# Patient Record
Sex: Male | Born: 1995 | Race: White | Hispanic: No | Marital: Single | Smoking: Former smoker
Health system: Southern US, Community
[De-identification: ages and names within clinical notes are randomized; demographics above are authoritative.]

## PROBLEM LIST (undated history)

## (undated) DIAGNOSIS — R350 Frequency of micturition: Secondary | ICD-10-CM

## (undated) DIAGNOSIS — F419 Anxiety disorder, unspecified: Secondary | ICD-10-CM

## (undated) DIAGNOSIS — K219 Gastro-esophageal reflux disease without esophagitis: Secondary | ICD-10-CM

## (undated) HISTORY — DX: Frequency of micturition: R35.0

## (undated) HISTORY — PX: OTHER SURGICAL HISTORY: SHX169

## (undated) HISTORY — DX: Anxiety disorder, unspecified: F41.9

---
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
## 2016-10-26 ENCOUNTER — Emergency Department (HOSPITAL_COMMUNITY)
Admission: EM | Admit: 2016-10-26 | Discharge: 2016-10-27 | Disposition: A | Discharge status: Home / Self Care | Payer: BLUE CROSS/BLUE SHIELD | Attending: Emergency Medicine | Admitting: Emergency Medicine

## 2016-10-26 DIAGNOSIS — R109 Unspecified abdominal pain: Secondary | ICD-10-CM

## 2016-10-26 DIAGNOSIS — Z79899 Other long term (current) drug therapy: Secondary | ICD-10-CM | POA: Diagnosis not present

## 2016-10-26 DIAGNOSIS — R1033 Periumbilical pain: Secondary | ICD-10-CM | POA: Insufficient documentation

## 2016-10-26 LAB — COMPREHENSIVE METABOLIC PANEL
ALT: 63 U/L (ref 17–63)
AST: 40 U/L (ref 15–41)
Albumin: 4.7 g/dL (ref 3.5–5.0)
Alkaline Phosphatase: 82 U/L (ref 38–126)
Anion gap: 11 (ref 5–15)
BUN: 15 mg/dL (ref 6–20)
CHLORIDE: 102 mmol/L (ref 101–111)
CO2: 23 mmol/L (ref 22–32)
CREATININE: 1.06 mg/dL (ref 0.61–1.24)
Calcium: 9.6 mg/dL (ref 8.9–10.3)
GFR calc Af Amer: >60 mL/min (ref >60)
GFR calc non Af Amer: >60 mL/min (ref >60)
Glucose, Bld: 89 mg/dL (ref 65–99)
Potassium: 4.7 mmol/L (ref 3.5–5.1)
Sodium: 136 mmol/L (ref 135–145)
Total Bilirubin: 0.6 mg/dL (ref 0.3–1.2)
Total Protein: 7.6 g/dL (ref 6.5–8.1)

## 2016-10-26 LAB — CBC
HCT: 45.9 % (ref 39.0–52.0)
Hemoglobin: 15.7 g/dL (ref 13.0–17.0)
MCH: 29.7 pg (ref 26.0–34.0)
MCHC: 34.2 g/dL (ref 30.0–36.0)
MCV: 86.8 fL (ref 78.0–100.0)
PLATELETS: 191 10*3/uL (ref 150–400)
RBC: 5.29 MIL/uL (ref 4.22–5.81)
RDW: 13.1 % (ref 11.5–15.5)
WBC: 6.4 10*3/uL (ref 4.0–10.5)

## 2016-10-26 LAB — URINALYSIS, ROUTINE W REFLEX MICROSCOPIC
Bilirubin Urine: NEGATIVE
GLUCOSE, UA: NEGATIVE mg/dL
Hgb urine dipstick: NEGATIVE
Ketones, ur: NEGATIVE mg/dL
Leukocytes, UA: NEGATIVE
Nitrite: NEGATIVE
PROTEIN: NEGATIVE mg/dL
Specific Gravity, Urine: 1.01 (ref 1.005–1.030)
pH: 5 (ref 5.0–8.0)

## 2016-10-26 LAB — LIPASE, BLOOD: LIPASE: 21 U/L (ref 11–51)

## 2016-10-26 MED ORDER — IOPAMIDOL (ISOVUE-300) INJECTION 61%
INTRAVENOUS | Status: AC
  Administered 2016-10-27: 100 mL
  Filled 2016-10-26: qty 100

## 2016-10-26 MED ORDER — FENTANYL CITRATE (PF) 100 MCG/2ML IJ SOLN
50.0000 ug | Freq: Once | INTRAMUSCULAR | Status: AC
  Administered 2016-10-27: 50 ug via INTRAVENOUS
  Filled 2016-10-26: qty 2

## 2016-10-26 NOTE — ED Triage Notes (Signed)
Pt reports abdominal pain around his umbilicus that started a week ago. Denies n/v but had diarrhea on Wednesday but none since.
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
## 2016-10-27 ENCOUNTER — Emergency Department (HOSPITAL_COMMUNITY): Payer: BLUE CROSS/BLUE SHIELD

## 2016-10-27 ENCOUNTER — Encounter (HOSPITAL_COMMUNITY): Payer: Self-pay

## 2016-10-27 MED ORDER — ONDANSETRON 4 MG PO TBDP: 4.0000 mg | ORAL_TABLET | Freq: Three times a day (TID) | ORAL | 0 refills | Status: DC | PRN

## 2016-10-27 NOTE — Discharge Instructions (Signed)
Your CT is negative for appendicitis. Keep yourself hydrated. Followup with your doctor. Return to the ED if you develop new or worsening symptoms.

## 2016-10-29 ENCOUNTER — Encounter (HOSPITAL_COMMUNITY): Payer: Self-pay | Admitting: Vascular Surgery

## 2016-10-29 ENCOUNTER — Emergency Department (HOSPITAL_COMMUNITY)
Admission: EM | Admit: 2016-10-29 | Discharge: 2016-10-29 | Disposition: A | Discharge status: Home / Self Care | Payer: BLUE CROSS/BLUE SHIELD | Attending: Emergency Medicine | Admitting: Emergency Medicine

## 2016-10-29 DIAGNOSIS — R109 Unspecified abdominal pain: Secondary | ICD-10-CM | POA: Diagnosis not present

## 2016-10-29 DIAGNOSIS — Z5321 Procedure and treatment not carried out due to patient leaving prior to being seen by health care provider: Secondary | ICD-10-CM | POA: Insufficient documentation

## 2016-10-29 LAB — URINALYSIS, ROUTINE W REFLEX MICROSCOPIC
Bilirubin Urine: NEGATIVE
GLUCOSE, UA: NEGATIVE mg/dL
HGB URINE DIPSTICK: NEGATIVE
Ketones, ur: NEGATIVE mg/dL
LEUKOCYTES UA: NEGATIVE
Nitrite: NEGATIVE
PH: 5 (ref 5.0–8.0)
Protein, ur: NEGATIVE mg/dL
Specific Gravity, Urine: 1.017 (ref 1.005–1.030)

## 2016-10-29 LAB — CBC
HEMATOCRIT: 46.8 % (ref 39.0–52.0)
Hemoglobin: 16.1 g/dL (ref 13.0–17.0)
MCH: 29.9 pg (ref 26.0–34.0)
MCHC: 34.4 g/dL (ref 30.0–36.0)
MCV: 86.8 fL (ref 78.0–100.0)
Platelets: 187 10*3/uL (ref 150–400)
RBC: 5.39 MIL/uL (ref 4.22–5.81)
RDW: 13 % (ref 11.5–15.5)
WBC: 7.4 10*3/uL (ref 4.0–10.5)

## 2016-10-29 LAB — COMPREHENSIVE METABOLIC PANEL
ALT: 49 U/L (ref 17–63)
ANION GAP: 8 (ref 5–15)
AST: 28 U/L (ref 15–41)
Albumin: 4.6 g/dL (ref 3.5–5.0)
Alkaline Phosphatase: 85 U/L (ref 38–126)
BUN: 13 mg/dL (ref 6–20)
CHLORIDE: 106 mmol/L (ref 101–111)
CO2: 25 mmol/L (ref 22–32)
Calcium: 10 mg/dL (ref 8.9–10.3)
Creatinine, Ser: 1.25 mg/dL — ABNORMAL HIGH (ref 0.61–1.24)
GFR calc Af Amer: >60 mL/min (ref >60)
GFR calc non Af Amer: >60 mL/min (ref >60)
Glucose, Bld: 107 mg/dL — ABNORMAL HIGH (ref 65–99)
Potassium: 4.9 mmol/L (ref 3.5–5.1)
SODIUM: 139 mmol/L (ref 135–145)
Total Bilirubin: 0.7 mg/dL (ref 0.3–1.2)
Total Protein: 7.3 g/dL (ref 6.5–8.1)

## 2016-10-29 LAB — LIPASE, BLOOD: LIPASE: 21 U/L (ref 11–51)

## 2016-10-29 NOTE — ED Triage Notes (Signed)
Pt reports to the ED for eval of abd pain around his umbilicus x over a week. Pt was seen on 5/4 for this pain and had a CT scan which did not show anything and he was d/c. He states that his pain never went away but today his pain became much worse. He called to schedule with his PCP but could not be seen until next week and he states he cannot wait. Pt clutching. Pt denies any aggravating or relieving factors. Pt has been taking bentyl without relief. Denies any N/V/D.

## 2016-10-29 NOTE — ED Notes (Signed)
Pt came to front desk and asked about the wait time. This RN informed pt of ER process. Pt left post triage after being encouraged to stay by this RN

## 2016-10-30 ENCOUNTER — Encounter: Payer: Self-pay | Admitting: Nurse Practitioner

## 2016-11-08 ENCOUNTER — Ambulatory Visit: Payer: BLUE CROSS/BLUE SHIELD | Admitting: Nurse Practitioner

## 2016-11-14 ENCOUNTER — Ambulatory Visit: Payer: BLUE CROSS/BLUE SHIELD | Admitting: Nurse Practitioner

## 2018-02-12 IMAGING — CT CT ABD-PELV W/ CM
2 of 4 series · 17 of 46 positions shown, 19 images · IV contrast (Omni 300)
Comparison: 10/22/2012

CLINICAL DATA: Periumbilical pain

EXAM:
CT ABDOMEN AND PELVIS WITH CONTRAST
TECHNIQUE: Multidetector CT imaging of the abdomen and pelvis was performed
using the standard protocol following bolus administration of
intravenous contrast.
CONTRAST:  100mL DP4JDA-677 IOPAMIDOL (DP4JDA-677) INJECTION 61%

[Series 3: a/p w/ 5mm · axial · 0.94mm/px · z∈[-351,+94]mm · 14 of 99 slices shown, 16 images]
[im 5/99  soft-tissue]
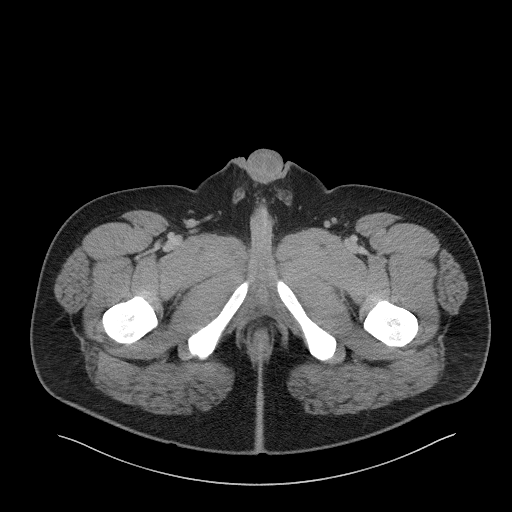
[im 5/99  bone]
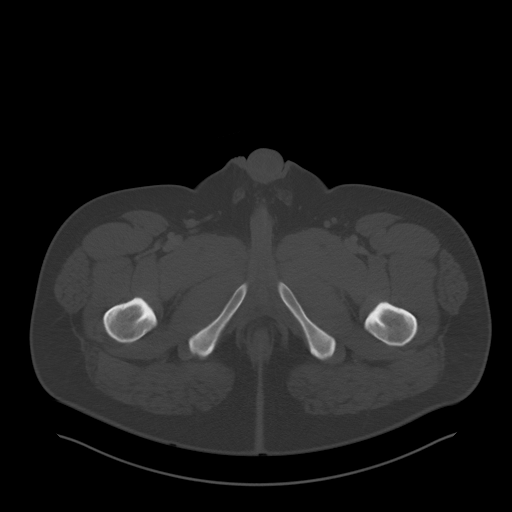
[im 13/99  soft-tissue]
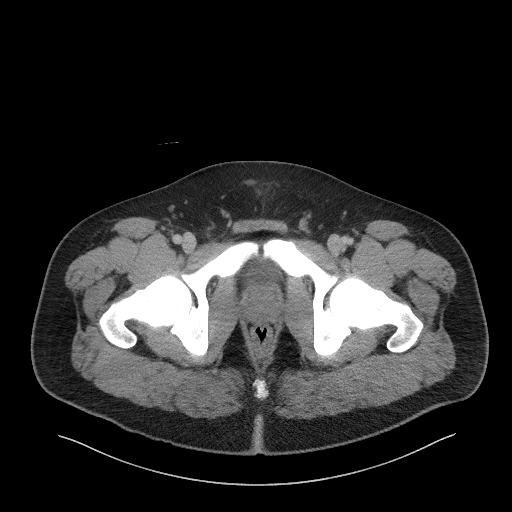
[im 18/99  soft-tissue]
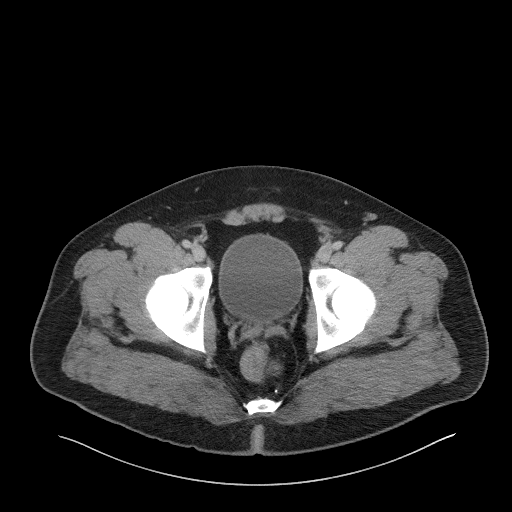
[im 26/99  soft-tissue]
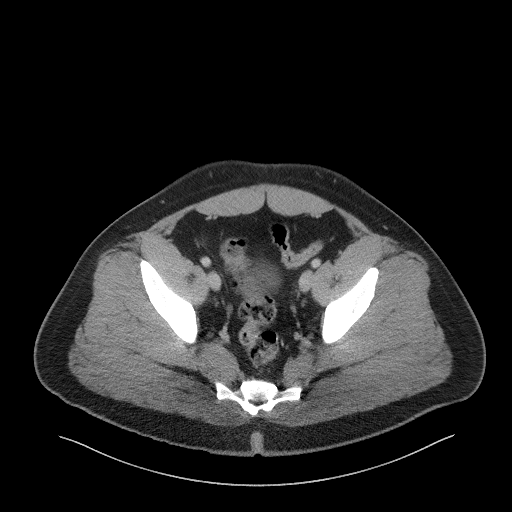
[im 35/99  soft-tissue]
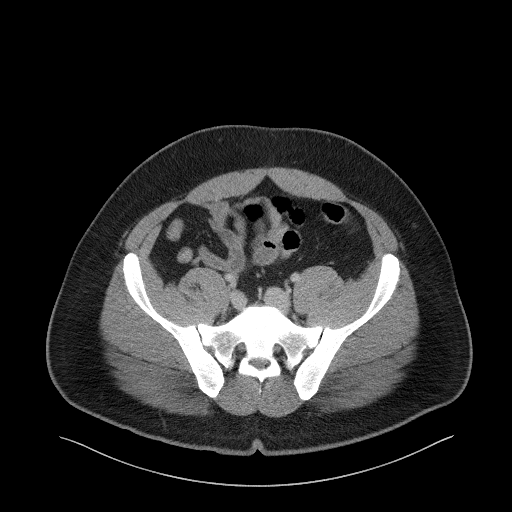
[im 39/99  soft-tissue]
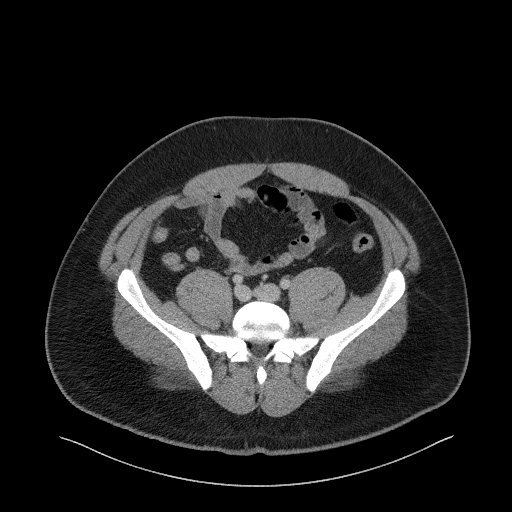
[im 47/99  soft-tissue]
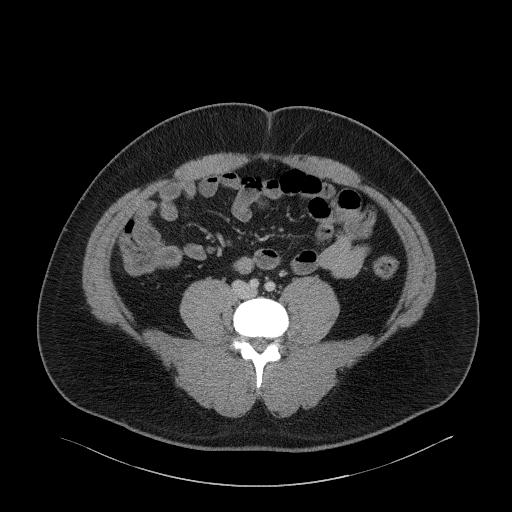
[im 52/99  soft-tissue]
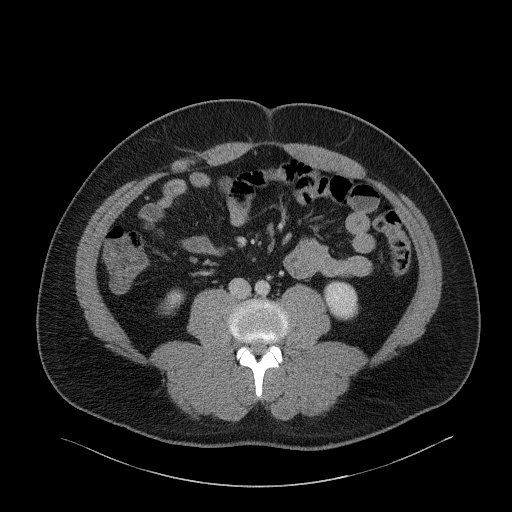
[im 60/99  soft-tissue]
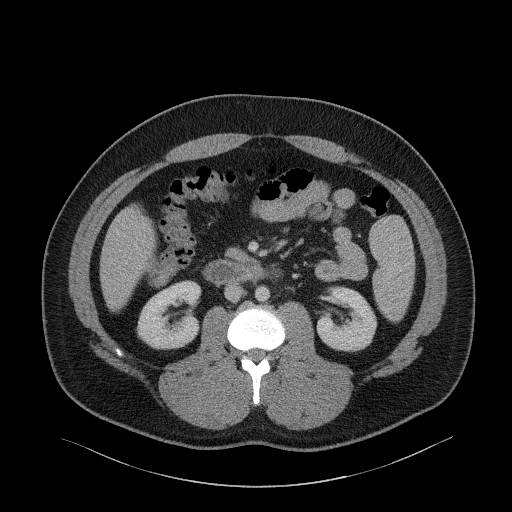
[im 60/99  bone]
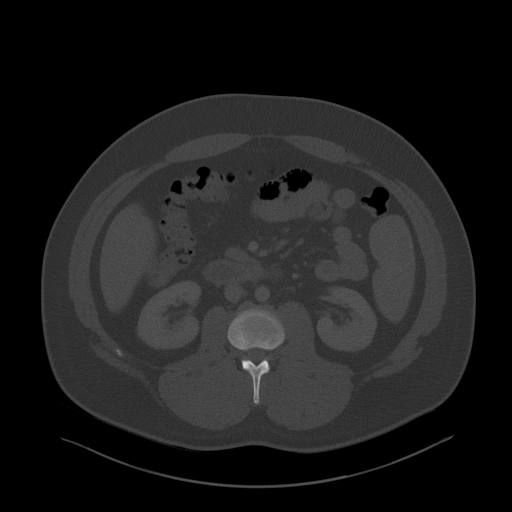
[im 64/99  soft-tissue]
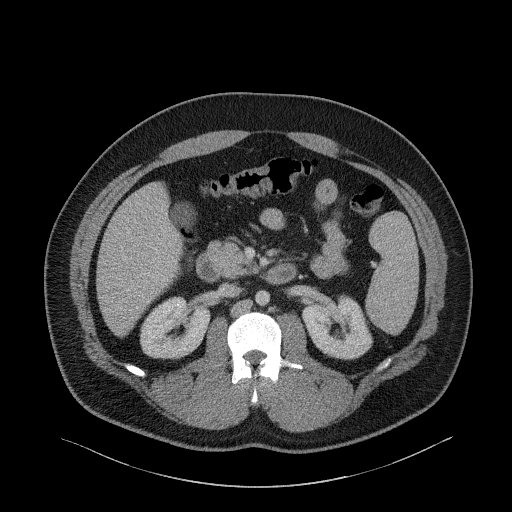
[im 73/99  soft-tissue]
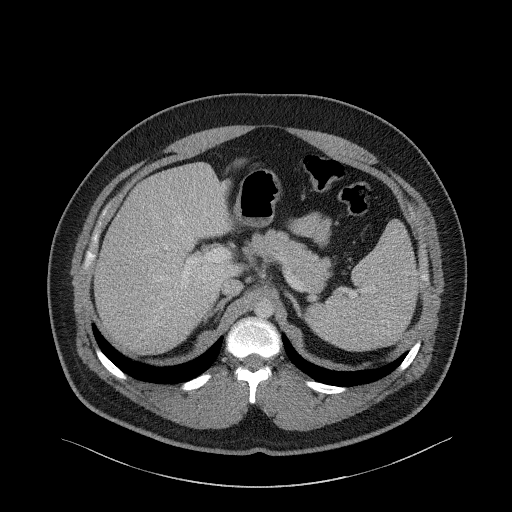
[im 81/99  soft-tissue]
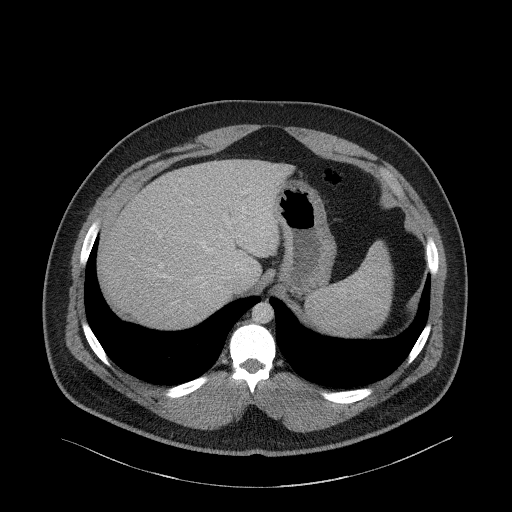
[im 86/99  soft-tissue]
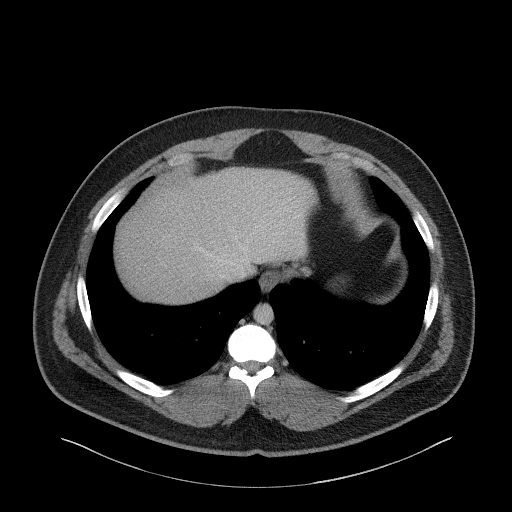
[im 94/99  soft-tissue]
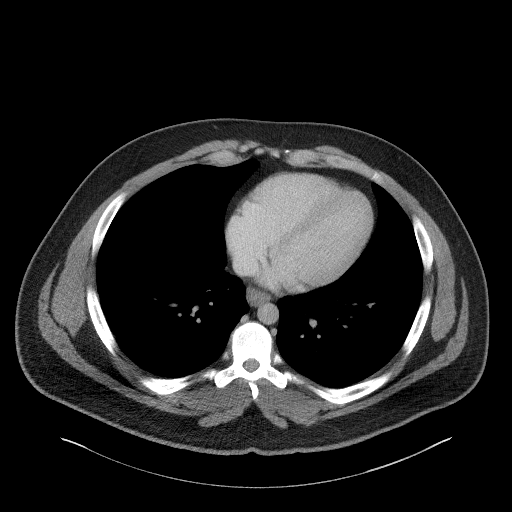

[Series 6: a/p w/ cor · coronal · 0.96mm/px · 3 of 160 slices shown]
[im 54/160  soft-tissue]
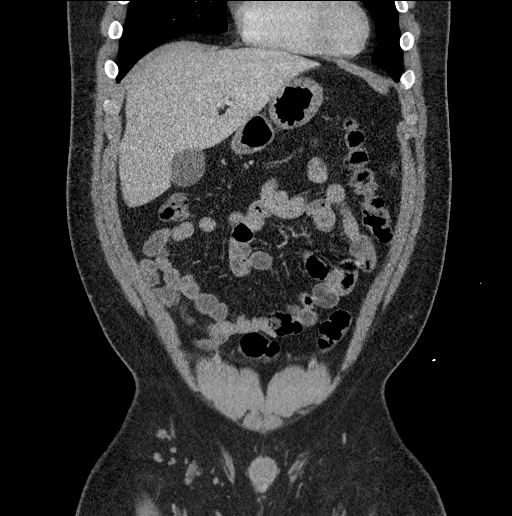
[im 71/160  soft-tissue]
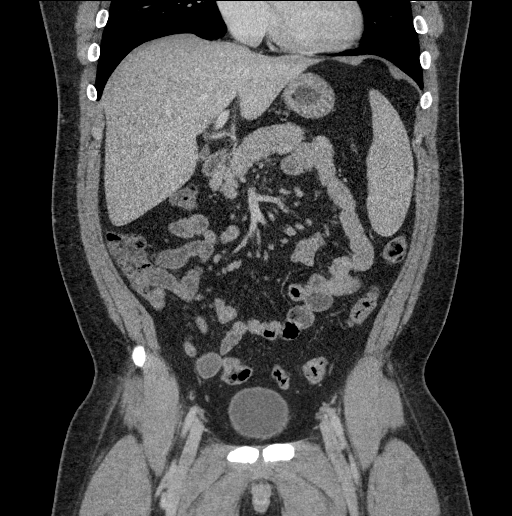
[im 89/160  soft-tissue]
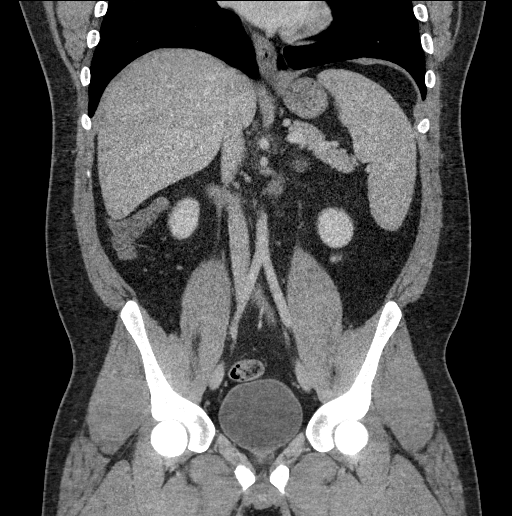

[17 of 46 positions shown; findings below may reference images not displayed]

FINDINGS: Lower chest: Lung bases demonstrate no acute consolidation or
effusion. The heart is nonenlarged.

Hepatobiliary: No focal liver abnormality is seen. No gallstones,
gallbladder wall thickening, or biliary dilatation.

Pancreas: Unremarkable. No pancreatic ductal dilatation or
surrounding inflammatory changes.

Spleen: Enlarged at 15.6 cm

Adrenals/Urinary Tract: Adrenal glands are within normal limits.
Punctate nonobstructing stone in the right kidney. Normal bladder

Stomach/Bowel: Stomach is within normal limits. Appendix appears
normal. No evidence of bowel wall thickening, distention, or
inflammatory changes.

Vascular/Lymphatic: No significant vascular findings are present. No
enlarged abdominal or pelvic lymph nodes.

Reproductive: Prostate is unremarkable.

Other: No abdominal wall hernia or abnormality. No abdominopelvic
ascites.

Musculoskeletal: No acute or significant osseous findings.
IMPRESSION: 1. No CT evidence for acute intra-abdominal or pelvic pathology.
Negative for appendicitis
2. Enlarged spleen.

## 2022-05-29 ENCOUNTER — Ambulatory Visit
Admission: EM | Admit: 2022-05-29 | Discharge: 2022-05-29 | Disposition: A | Discharge status: Home / Self Care | Payer: BC Managed Care – PPO | Attending: Family Medicine | Admitting: Family Medicine

## 2022-05-29 DIAGNOSIS — R6889 Other general symptoms and signs: Secondary | ICD-10-CM

## 2022-05-29 LAB — POC SARS CORONAVIRUS 2 AG -  ED: SARS Coronavirus 2 Ag: NEGATIVE

## 2022-05-29 LAB — POCT INFLUENZA A/B
Influenza A, POC: NEGATIVE
Influenza B, POC: NEGATIVE
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
## 2022-05-29 NOTE — ED Triage Notes (Signed)
Pt states that he has some chills, body aches, cough, sore throat, chest congestion, and headache. X1 day   Pt is vaccinated for covid.

## 2022-05-29 NOTE — Discharge Instructions (Signed)
Home Rest Push fluids

## 2022-09-04 ENCOUNTER — Encounter: Payer: Self-pay | Admitting: Family Medicine

## 2022-09-04 ENCOUNTER — Ambulatory Visit: Payer: BC Managed Care – PPO | Admitting: Family Medicine

## 2022-09-04 VITALS — BP 138/75 | HR 64 | Ht 71.0 in | Wt 246.1 lb

## 2022-09-04 DIAGNOSIS — F411 Generalized anxiety disorder: Secondary | ICD-10-CM | POA: Diagnosis not present

## 2022-09-04 MED ORDER — ESCITALOPRAM OXALATE 10 MG PO TABS: 10.0000 mg | ORAL_TABLET | Freq: Every day | ORAL | 3 refills | Status: DC

## 2022-09-04 NOTE — Assessment & Plan Note (Addendum)
-   start lexapro 10mg . Discussed side effects - follow up in 4 weeks for efficacy  - GAD7: 9

## 2022-09-04 NOTE — Progress Notes (Signed)
     Established patient visit   Patient: [name]   DOB: [birth year]   27 y.o. Male  MRN: [id number] Visit Date: 09/04/2022  Today's healthcare provider: Owens Loffler, DO   Chief Complaint  Patient presents with   New Patient (Initial Visit)    SUBJECTIVE    Chief Complaint  Patient presents with   New Patient (Initial Visit)   HPI  Pt presents to establish care.   Anxiety  Pt presents for concerns of anxiety. He has a hx of anxiety. He is currently a caretaker and started a new job. Denies homicidal or suicidal ideation. He is open to medication.   Review of Systems  Constitutional:  Negative for activity change, fatigue and fever.  Respiratory:  Negative for cough and shortness of breath.   Cardiovascular:  Negative for chest pain.  Gastrointestinal:  Negative for abdominal pain.  Genitourinary:  Negative for difficulty urinating.  Psychiatric/Behavioral:  Negative for sleep disturbance and suicidal ideas.        Current Meds  Medication Sig   escitalopram (LEXAPRO) 10 MG tablet Take 1 tablet (10 mg total) by mouth daily.    OBJECTIVE    BP 138/75 (BP Location: Left Arm, Patient Position: Sitting, Cuff Size: Large)   Pulse 64   Ht 5\' 11"  (1.803 m)   Wt 246 lb 1.9 oz (111.6 kg)   SpO2 100%   BMI 34.33 kg/m   Physical Exam Vitals and nursing note reviewed.  Constitutional:      General: He is not in acute distress.    Appearance: Normal appearance.  HENT:     Head: Normocephalic and atraumatic.     Right Ear: External ear normal.     Left Ear: External ear normal.     Nose: Nose normal.  Eyes:     Conjunctiva/sclera: Conjunctivae normal.  Cardiovascular:     Rate and Rhythm: Normal rate and regular rhythm.  Pulmonary:     Effort: Pulmonary effort is normal.     Breath sounds: Normal breath sounds.  Neurological:     General: No focal deficit present.     Mental Status: He is alert and oriented to person, place, and time.  Psychiatric:         Mood and Affect: Mood normal.        Behavior: Behavior normal.        Thought Content: Thought content normal.        Judgment: Judgment normal.        ASSESSMENT & PLAN    Problem List Items Addressed This Visit       Other   Generalized anxiety disorder - Primary    - start lexapro 10mg . Discussed side effects - follow up in 4 weeks for efficacy  - GAD7: 9      Relevant Medications   escitalopram (LEXAPRO) 10 MG tablet    Return in about 4 weeks (around 10/02/2022).      Meds ordered this encounter  Medications   escitalopram (LEXAPRO) 10 MG tablet    Sig: Take 1 tablet (10 mg total) by mouth daily.    Dispense:  30 tablet    Refill:  3    No orders of the defined types were placed in this encounter.    Owens Loffler, DO  [facility] [phone] (phone) [phone] (fax)  [place]

## 2022-10-01 NOTE — Progress Notes (Unsigned)
     Established patient visit   Patient: [name]   DOB: 03-06-1996   26 y.o. Male  MRN: [id number] Visit Date: 10/02/2022  Today's healthcare provider: Charlton Amor, DO   No chief complaint on file.   SUBJECTIVE   No chief complaint on file.  HPI  Pt presents for follow up on GAD. He is currently on lexapro 10mg  daily.  Review of Systems     No outpatient medications have been marked as taking for the 10/02/22 encounter (Appointment) with Charlton Amor, DO.    OBJECTIVE    There were no vitals taken for this visit.  Physical Exam   {Show previous labs (optional):23736}    ASSESSMENT & PLAN    Problem List Items Addressed This Visit   None   No follow-ups on file.      No orders of the defined types were placed in this encounter.   No orders of the defined types were placed in this encounter.    Charlton Amor, DO  [facility] at [facility] [phone] (phone) [phone] (fax)  [facility]

## 2022-10-02 ENCOUNTER — Ambulatory Visit: Payer: BC Managed Care – PPO | Admitting: Family Medicine

## 2022-10-02 ENCOUNTER — Encounter: Payer: Self-pay | Admitting: Family Medicine

## 2022-10-02 VITALS — BP 134/68 | HR 58 | Temp 98.8°F | Ht 71.0 in | Wt 245.0 lb

## 2022-10-02 DIAGNOSIS — F411 Generalized anxiety disorder: Secondary | ICD-10-CM | POA: Diagnosis not present

## 2022-10-02 MED ORDER — ESCITALOPRAM OXALATE 10 MG PO TABS: 10.0000 mg | ORAL_TABLET | Freq: Every day | ORAL | 0 refills | Status: DC

## 2022-10-02 NOTE — Assessment & Plan Note (Signed)
-   continue lexapro 10mg . We did discuss ability to increase to 20mg  and pt is hesitant at this time. He may try to increase to 15mg  to which I said is ok.

## 2022-10-31 ENCOUNTER — Encounter: Payer: BC Managed Care – PPO | Admitting: Family Medicine

## 2022-11-21 ENCOUNTER — Encounter: Payer: BC Managed Care – PPO | Admitting: Family Medicine

## 2022-12-28 ENCOUNTER — Other Ambulatory Visit: Payer: Self-pay | Admitting: Family Medicine

## 2023-04-24 ENCOUNTER — Ambulatory Visit: Payer: BC Managed Care – PPO

## 2023-04-24 ENCOUNTER — Encounter: Payer: Self-pay | Admitting: Family Medicine

## 2023-04-24 ENCOUNTER — Ambulatory Visit: Payer: BC Managed Care – PPO | Admitting: Family Medicine

## 2023-04-24 VITALS — BP 126/66 | HR 56 | Ht 71.0 in | Wt 258.8 lb

## 2023-04-24 DIAGNOSIS — M79672 Pain in left foot: Secondary | ICD-10-CM

## 2023-04-24 MED ORDER — MELOXICAM 15 MG PO TABS: 15.0000 mg | ORAL_TABLET | Freq: Every day | ORAL | 0 refills | Status: DC

## 2023-04-24 NOTE — Assessment & Plan Note (Addendum)
Recommend RICE therapy in addition to meloxicam, due to chronicity and trauma will go ahead and get xrays. Notes pain in the 5th metatarsal area and cuboid region. If xrays are negative we can consider PT for 4-6 weeks prior to further mri imaging
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
## 2023-04-30 ENCOUNTER — Encounter: Payer: BC Managed Care – PPO | Admitting: Family Medicine

## 2023-05-14 ENCOUNTER — Other Ambulatory Visit: Payer: Self-pay | Admitting: Family Medicine

## 2023-05-14 DIAGNOSIS — M79672 Pain in left foot: Secondary | ICD-10-CM

## 2023-07-01 ENCOUNTER — Encounter (INDEPENDENT_AMBULATORY_CARE_PROVIDER_SITE_OTHER): Payer: Self-pay | Admitting: Family Medicine

## 2023-07-01 DIAGNOSIS — Z13228 Encounter for screening for other metabolic disorders: Secondary | ICD-10-CM

## 2023-07-02 NOTE — Telephone Encounter (Signed)
 Please see the MyChart message reply(ies) for my assessment and plan.    This patient gave consent for this Medical Advice Message and is aware that it may result in a bill to [employer], as well as the possibility of receiving a bill for a co-payment or deductible. They are an established patient, but are not seeking medical advice exclusively about a problem treated during an in person or video visit in the last seven days. I did not recommend an in person or video visit within seven days of my reply.    I spent a total of 12 minutes cumulative time within 7 days through [employer].  Pt needing CF testing for carrier screen. Have sent in [facility] order  Bernice GORMAN Juneau, DO

## 2023-07-02 NOTE — Addendum Note (Signed)
 Addended by: Charlton Amor on: 07/02/2023 10:47 AM   Modules accepted: Orders

## 2023-07-16 ENCOUNTER — Encounter: Payer: Self-pay | Admitting: Family Medicine

## 2023-07-16 LAB — CF FULL-GENE CARRIER SCREEN

## 2023-07-24 ENCOUNTER — Ambulatory Visit (INDEPENDENT_AMBULATORY_CARE_PROVIDER_SITE_OTHER): Payer: 59 | Admitting: Family Medicine

## 2023-07-24 VITALS — BP 137/81 | HR 54 | Ht 71.0 in | Wt 259.0 lb

## 2023-07-24 DIAGNOSIS — R41840 Attention and concentration deficit: Secondary | ICD-10-CM

## 2023-07-24 DIAGNOSIS — Z0000 Encounter for general adult medical examination without abnormal findings: Secondary | ICD-10-CM | POA: Diagnosis not present

## 2023-07-24 DIAGNOSIS — B354 Tinea corporis: Secondary | ICD-10-CM | POA: Diagnosis not present

## 2023-07-24 DIAGNOSIS — F411 Generalized anxiety disorder: Secondary | ICD-10-CM | POA: Diagnosis not present

## 2023-07-24 MED ORDER — ESCITALOPRAM OXALATE 10 MG PO TABS: 10.0000 mg | ORAL_TABLET | Freq: Every day | ORAL | 1 refills | Status: DC

## 2023-07-24 NOTE — Assessment & Plan Note (Signed)
Will go ahead and do lexapro 10mg  with follow up  - pt denies any homicidal or suicidal ideation - have also ordered TSH to rule out thyroid disorder as the cause of anxiety - he does see a therapist and says it is going well for him.  - have also ordered ADHD testing for patient as some of his symptoms-lack of focus, lack of listening and being forgetful can be closely related to ADHD

## 2023-07-24 NOTE — Assessment & Plan Note (Signed)
-  Have gone ahead and ordered blood work

## 2023-07-24 NOTE — Progress Notes (Signed)
Established patient visit   Patient: [name]   DOB: 04/24/1996   28 y.o. Male  MRN: [id number] Visit Date: 07/24/2023  Today's healthcare provider: Charlton Amor, DO   Chief Complaint  Patient presents with   Anxiety    SUBJECTIVE    Chief Complaint  Patient presents with   Anxiety   HPI  Pt presents for wellness exam and to discuss anxiety  Notes anxiety has gotten worse and he is unsure if it is due to him becoming a father (baby due In June). Admits to being forgetful.    Review of Systems  Constitutional:  Negative for activity change, fatigue and fever.  Respiratory:  Negative for cough and shortness of breath.   Cardiovascular:  Negative for chest pain.  Gastrointestinal:  Negative for abdominal pain.  Genitourinary:  Negative for difficulty urinating.       Current Meds  Medication Sig   escitalopram (LEXAPRO) 10 MG tablet Take 1 tablet (10 mg total) by mouth daily.    OBJECTIVE    BP 137/81 (BP Location: Left Arm, Patient Position: Sitting, Cuff Size: Large)   Pulse (!) 54   Ht 5\' 11"  (1.803 m)   Wt 259 lb (117.5 kg)   SpO2 100%   BMI 36.12 kg/m   Physical Exam Vitals and nursing note reviewed.  Constitutional:      General: He is not in acute distress.    Appearance: Normal appearance.  HENT:     Head: Normocephalic and atraumatic.     Right Ear: Tympanic membrane, ear canal and external ear normal.     Left Ear: Tympanic membrane, ear canal and external ear normal.     Nose: Nose normal.  Eyes:     Conjunctiva/sclera: Conjunctivae normal.  Cardiovascular:     Rate and Rhythm: Normal rate and regular rhythm.  Pulmonary:     Effort: Pulmonary effort is normal.     Breath sounds: Normal breath sounds.  Abdominal:     General: Abdomen is flat. Bowel sounds are normal.     Palpations: Abdomen is soft.     Tenderness: There is no abdominal tenderness.  Neurological:     General: No focal deficit present.     Mental Status: He  is alert and oriented to person, place, and time.  Psychiatric:        Mood and Affect: Mood normal.        Behavior: Behavior normal.        Thought Content: Thought content normal.        Judgment: Judgment normal.        ASSESSMENT & PLAN    Problem List Items Addressed This Visit       Other   Generalized anxiety disorder - Primary   Will go ahead and do lexapro 10mg  with follow up  - pt denies any homicidal or suicidal ideation - have also ordered TSH to rule out thyroid disorder as the cause of anxiety - he does see a therapist and says it is going well for him.  - have also ordered ADHD testing for patient as some of his symptoms-lack of focus, lack of listening and being forgetful can be closely related to ADHD      Relevant Medications   escitalopram (LEXAPRO) 10 MG tablet   Other Relevant Orders   TSH + free T4   Routine adult health maintenance   Have gone ahead and ordered blood work  Relevant Orders   CBC   CMP14+EGFR   Lipid panel   Other Visit Diagnoses       Concentration deficit       Relevant Orders   Ambulatory referral to Psychology       Return in about 6 weeks (around 09/04/2023) for lexapro follow up with Dr. Ashley Royalty.      Meds ordered this encounter  Medications   escitalopram (LEXAPRO) 10 MG tablet    Sig: Take 1 tablet (10 mg total) by mouth daily.    Dispense:  30 tablet    Refill:  1    Orders Placed This Encounter  Procedures   TSH + free T4   CBC   CMP14+EGFR    Has the patient fasted?:   No   Lipid panel    Has the patient fasted?:   No    Release to patient:   Immediate   Ambulatory referral to Psychology    Referral Priority:   Routine    Referral Type:   Psychiatric    Referral Reason:   Specialty Services Required    Requested Specialty:   Psychology    Number of Visits Requested:   1     Charlton Amor, DO  [facility] Primary [facility] & Sports Medicine at [facility] [phone]  (phone) [phone] (fax)  [facility]

## 2023-07-25 ENCOUNTER — Encounter: Payer: Self-pay | Admitting: Family Medicine

## 2023-07-25 DIAGNOSIS — B354 Tinea corporis: Secondary | ICD-10-CM | POA: Insufficient documentation

## 2023-07-25 LAB — CBC
Hematocrit: 43.1 % (ref 37.5–51.0)
Hemoglobin: 14.6 g/dL (ref 13.0–17.7)
MCH: 30.1 pg (ref 26.6–33.0)
MCHC: 33.9 g/dL (ref 31.5–35.7)
MCV: 89 fL (ref 79–97)
Platelets: 173 10*3/uL (ref 150–450)
RBC: 4.85 x10E6/uL (ref 4.14–5.80)
RDW: 12.3 % (ref 11.6–15.4)
WBC: 5.1 10*3/uL (ref 3.4–10.8)

## 2023-07-25 LAB — CMP14+EGFR
ALT: 25 [IU]/L (ref 0–44)
AST: 18 [IU]/L (ref 0–40)
Albumin: 4.8 g/dL (ref 4.3–5.2)
Alkaline Phosphatase: 87 [IU]/L (ref 44–121)
BUN/Creatinine Ratio: 16 (ref 9–20)
BUN: 16 mg/dL (ref 6–20)
Bilirubin Total: 0.5 mg/dL (ref 0.0–1.2)
CO2: 24 mmol/L (ref 20–29)
Calcium: 9.9 mg/dL (ref 8.7–10.2)
Chloride: 101 mmol/L (ref 96–106)
Creatinine, Ser: 1.02 mg/dL (ref 0.76–1.27)
Globulin, Total: 2.2 g/dL (ref 1.5–4.5)
Glucose: 77 mg/dL (ref 70–99)
Potassium: 3.8 mmol/L (ref 3.5–5.2)
Sodium: 141 mmol/L (ref 134–144)
Total Protein: 7 g/dL (ref 6.0–8.5)
eGFR: 102 mL/min/{1.73_m2} (ref >59)

## 2023-07-25 LAB — TSH+FREE T4
Free T4: 1.28 ng/dL (ref 0.82–1.77)
TSH: 2.55 u[IU]/mL (ref 0.450–4.500)

## 2023-07-25 LAB — LIPID PANEL
Chol/HDL Ratio: 4.7 {ratio} (ref 0.0–5.0)
Cholesterol, Total: 168 mg/dL (ref 100–199)
HDL: 36 mg/dL — ABNORMAL LOW (ref >39)
LDL Chol Calc (NIH): 105 mg/dL — ABNORMAL HIGH (ref 0–99)
Triglycerides: 154 mg/dL — ABNORMAL HIGH (ref 0–149)
VLDL Cholesterol Cal: 27 mg/dL (ref 5–40)

## 2023-07-25 MED ORDER — CLOTRIMAZOLE 1 % EX CREA: 1.0000 | TOPICAL_CREAM | Freq: Two times a day (BID) | CUTANEOUS | 0 refills | Status: DC

## 2023-07-25 NOTE — Addendum Note (Signed)
Addended by: Charlton Amor on: 07/25/2023 04:44 PM   Modules accepted: Orders

## 2023-07-25 NOTE — Assessment & Plan Note (Signed)
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

## 2023-09-09 ENCOUNTER — Ambulatory Visit (INDEPENDENT_AMBULATORY_CARE_PROVIDER_SITE_OTHER): Payer: 59 | Admitting: Family Medicine

## 2023-09-09 ENCOUNTER — Encounter: Payer: Self-pay | Admitting: Family Medicine

## 2023-09-09 DIAGNOSIS — F411 Generalized anxiety disorder: Secondary | ICD-10-CM | POA: Diagnosis not present

## 2023-09-09 MED ORDER — HYDROXYZINE HCL 25 MG PO TABS
12.5000 mg | ORAL_TABLET | Freq: Three times a day (TID) | ORAL | 0 refills | Status: AC | PRN
Start: 2023-09-09 — End: ?

## 2023-09-09 MED ORDER — ESCITALOPRAM OXALATE 10 MG PO TABS
10.0000 mg | ORAL_TABLET | Freq: Every day | ORAL | 1 refills | Status: AC
Start: 2023-09-09 — End: ?
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
## 2023-09-09 NOTE — Assessment & Plan Note (Signed)
 This he is doing well with Lexapro at current strength.  Will plan to continue this at current strength.  Adding Vistaril as needed for breakthrough anxiety.  Will plan to follow-up in about 3 months.

## 2023-09-30 ENCOUNTER — Ambulatory Visit: Payer: Self-pay

## 2023-09-30 NOTE — Telephone Encounter (Addendum)
  Chief Complaint: back pain Symptoms: lifting injury Frequency: x 1 week Pertinent Negatives: Patient denies immobility Disposition: [] ED /[] Urgent Care (no appt availability in office) / [] Appointment(In office/virtual)/ [x]  [facility]/ [] Home Care/ [] Refused Recommended Disposition /[] [facility]/ []  Follow-up with PCP Additional Notes: Pt c.o back pain s/p moving furniture. Pt reports that pain is intermittent, and sometimes worsens with certain movements but is still able to walk. Offered acute appt with PCP, but Pt requested appt with Dr. Karie Schwalbe. Transferred to Dr. Karie Schwalbe CAL for scheduling appt based on dispo.    Copied from CRM [id number]. Topic: Clinical - Red Word Triage >> Sep 30, 2023  3:52 PM Alvino Blood C wrote: Red Word that prompted transfer to Nurse Triage: Patient states he threw his back out and has been having pain for the last week. He states the pain level is at an 8 Reason for Disposition  [1] MODERATE back pain (e.g., interferes with normal activities) AND [2] present > 3 days  Answer Assessment - Initial Assessment Questions 1. ONSET: "When did the pain begin?"      X 1 week 2. LOCATION: "Where does it hurt?" (upper, mid or lower back)     middle 3. SEVERITY: "How bad is the pain?"  (e.g., Scale 1-10; mild, moderate, or severe)   - MILD (1-3): Doesn't interfere with normal activities.    - MODERATE (4-7): Interferes with normal activities or awakens from sleep.    - SEVERE (8-10): Excruciating pain, unable to do any normal activities.      8/10 4. PATTERN: "Is the pain constant?" (e.g., yes, no; constant, intermittent)      Comes and goes, with certain movements 5. RADIATION: "Does the pain shoot into your legs or somewhere else?"     Radiates around rib cage 6. CAUSE:  "What do you think is causing the back pain?"      Moving furniture last week 7. BACK OVERUSE:  "Any recent lifting of heavy objects, strenuous work or exercise?"     lifting 8.  MEDICINES: "What have you taken so far for the pain?" (e.g., nothing, acetaminophen, NSAIDS)     Ibuprofen with minimal relief 9. NEUROLOGIC SYMPTOMS: "Do you have any weakness, numbness, or problems with bowel/bladder control?"     denies 10. OTHER SYMPTOMS: "Do you have any other symptoms?" (e.g., fever, abdomen pain, burning with urination, blood in urine)       denies  Protocols used: Back Pain-A-AH

## 2023-10-01 NOTE — Telephone Encounter (Signed)
 Patient scheduled 10/02/23 with Dr. Ashley Royalty

## 2023-10-02 ENCOUNTER — Ambulatory Visit: Admitting: Family Medicine

## 2023-10-02 ENCOUNTER — Encounter: Payer: Self-pay | Admitting: Family Medicine

## 2023-10-02 VITALS — BP 108/65 | HR 64 | Ht 71.0 in | Wt 252.0 lb

## 2023-10-02 DIAGNOSIS — S29012A Strain of muscle and tendon of back wall of thorax, initial encounter: Secondary | ICD-10-CM | POA: Diagnosis not present

## 2023-10-02 DIAGNOSIS — Z23 Encounter for immunization: Secondary | ICD-10-CM

## 2023-10-02 NOTE — Assessment & Plan Note (Signed)
 He prefers to not add on any additional medication.  Reviewed proper lifting technique.  Given handout for HEP.  Can add heat. F/u in 2-3 weeks if not improving.

## 2023-10-02 NOTE — Addendum Note (Signed)
 Addended by: Ardyth Man on: 10/02/2023 04:36 PM   Modules accepted: Orders
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
## 2023-10-10 ENCOUNTER — Encounter: Admitting: Sports Medicine

## 2023-10-31 ENCOUNTER — Encounter: Payer: Self-pay | Admitting: Family Medicine

## 2023-12-10 ENCOUNTER — Ambulatory Visit (INDEPENDENT_AMBULATORY_CARE_PROVIDER_SITE_OTHER): Admitting: Family Medicine

## 2023-12-10 VITALS — BP 121/75 | HR 73 | Ht 71.0 in | Wt 233.0 lb

## 2023-12-10 DIAGNOSIS — F411 Generalized anxiety disorder: Secondary | ICD-10-CM

## 2023-12-10 NOTE — Assessment & Plan Note (Signed)
 He continues to do well with lexapro  at current strength.  Will plan to continue and have him follow up in 6 months.
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
## 2024-02-25 ENCOUNTER — Encounter: Payer: Self-pay | Admitting: Sports Medicine

## 2024-05-25 ENCOUNTER — Ambulatory Visit: Admitting: Family Medicine

## 2024-06-29 ENCOUNTER — Other Ambulatory Visit: Payer: Self-pay | Admitting: Family Medicine

## 2024-06-29 MED ORDER — OSELTAMIVIR PHOSPHATE 75 MG PO CAPS: 75.0000 mg | ORAL_CAPSULE | Freq: Two times a day (BID) | ORAL | 0 refills | Status: AC

## 2024-06-29 NOTE — Progress Notes (Signed)
 Patient spouse positive for inflenza A and he started having symptoms <48 hours ago including fever and congestion.  Adding tamiflu  treatment course.
